# Patient Record
Sex: Female | Born: 1985 | Race: White | Hispanic: No | Marital: Single | Smoking: Never smoker
Health system: Southern US, Community
[De-identification: ages and names within clinical notes are randomized; demographics above are authoritative.]

## PROBLEM LIST (undated history)

## (undated) HISTORY — PX: WISDOM TOOTH EXTRACTION: SHX21

---

## 2017-11-21 ENCOUNTER — Other Ambulatory Visit: Payer: Self-pay | Admitting: Family

## 2017-11-21 DIAGNOSIS — R51 Headache: Principal | ICD-10-CM

## 2017-11-21 DIAGNOSIS — R519 Headache, unspecified: Secondary | ICD-10-CM

## 2017-11-21 DIAGNOSIS — G8929 Other chronic pain: Secondary | ICD-10-CM

## 2017-11-27 ENCOUNTER — Ambulatory Visit
Admission: RE | Admit: 2017-11-27 | Discharge: 2017-11-27 | Disposition: A | Discharge status: Home / Self Care | Payer: BLUE CROSS/BLUE SHIELD | Source: Ambulatory Visit | Attending: Family | Admitting: Family

## 2017-11-27 DIAGNOSIS — G8929 Other chronic pain: Secondary | ICD-10-CM

## 2017-11-27 DIAGNOSIS — R51 Headache: Principal | ICD-10-CM

## 2017-11-27 DIAGNOSIS — R519 Headache, unspecified: Secondary | ICD-10-CM

## 2017-11-27 MED ORDER — IOPAMIDOL (ISOVUE-300) INJECTION 61%
75.0000 mL | Freq: Once | INTRAVENOUS | Status: AC | PRN
  Administered 2017-11-27: 75 mL via INTRAVENOUS

## 2018-02-12 DIAGNOSIS — G43719 Chronic migraine without aura, intractable, without status migrainosus: Secondary | ICD-10-CM | POA: Diagnosis not present

## 2018-03-26 DIAGNOSIS — G43719 Chronic migraine without aura, intractable, without status migrainosus: Secondary | ICD-10-CM | POA: Diagnosis not present

## 2018-03-27 DIAGNOSIS — R51 Headache: Secondary | ICD-10-CM | POA: Diagnosis not present

## 2018-05-25 DIAGNOSIS — G43719 Chronic migraine without aura, intractable, without status migrainosus: Secondary | ICD-10-CM | POA: Diagnosis not present

## 2018-07-06 DIAGNOSIS — G43719 Chronic migraine without aura, intractable, without status migrainosus: Secondary | ICD-10-CM | POA: Diagnosis not present

## 2018-09-16 DIAGNOSIS — Z01419 Encounter for gynecological examination (general) (routine) without abnormal findings: Secondary | ICD-10-CM | POA: Diagnosis not present

## 2018-09-16 DIAGNOSIS — Z124 Encounter for screening for malignant neoplasm of cervix: Secondary | ICD-10-CM | POA: Diagnosis not present

## 2018-09-16 DIAGNOSIS — Z681 Body mass index (BMI) 19 or less, adult: Secondary | ICD-10-CM | POA: Diagnosis not present

## 2018-09-23 DIAGNOSIS — R5383 Other fatigue: Secondary | ICD-10-CM | POA: Diagnosis not present

## 2018-09-23 DIAGNOSIS — R002 Palpitations: Secondary | ICD-10-CM | POA: Diagnosis not present

## 2018-09-23 DIAGNOSIS — R079 Chest pain, unspecified: Secondary | ICD-10-CM | POA: Diagnosis not present

## 2018-09-23 DIAGNOSIS — R110 Nausea: Secondary | ICD-10-CM | POA: Diagnosis not present

## 2018-09-28 DIAGNOSIS — R5383 Other fatigue: Secondary | ICD-10-CM | POA: Diagnosis not present

## 2018-09-28 DIAGNOSIS — M25561 Pain in right knee: Secondary | ICD-10-CM | POA: Diagnosis not present

## 2018-09-28 DIAGNOSIS — M25562 Pain in left knee: Secondary | ICD-10-CM | POA: Diagnosis not present

## 2018-09-28 DIAGNOSIS — M545 Low back pain: Secondary | ICD-10-CM | POA: Diagnosis not present

## 2018-09-28 DIAGNOSIS — G43719 Chronic migraine without aura, intractable, without status migrainosus: Secondary | ICD-10-CM | POA: Diagnosis not present

## 2018-10-19 DIAGNOSIS — R079 Chest pain, unspecified: Secondary | ICD-10-CM | POA: Diagnosis not present

## 2018-10-26 DIAGNOSIS — Z681 Body mass index (BMI) 19 or less, adult: Secondary | ICD-10-CM | POA: Diagnosis not present

## 2018-10-26 DIAGNOSIS — R079 Chest pain, unspecified: Secondary | ICD-10-CM | POA: Diagnosis not present

## 2018-11-02 DIAGNOSIS — M545 Low back pain: Secondary | ICD-10-CM | POA: Diagnosis not present

## 2018-11-02 DIAGNOSIS — M62830 Muscle spasm of back: Secondary | ICD-10-CM | POA: Diagnosis not present

## 2018-11-02 DIAGNOSIS — M9903 Segmental and somatic dysfunction of lumbar region: Secondary | ICD-10-CM | POA: Diagnosis not present

## 2018-11-17 DIAGNOSIS — M9903 Segmental and somatic dysfunction of lumbar region: Secondary | ICD-10-CM | POA: Diagnosis not present

## 2018-11-17 DIAGNOSIS — M62830 Muscle spasm of back: Secondary | ICD-10-CM | POA: Diagnosis not present

## 2018-11-17 DIAGNOSIS — M545 Low back pain: Secondary | ICD-10-CM | POA: Diagnosis not present

## 2018-12-21 DIAGNOSIS — Z681 Body mass index (BMI) 19 or less, adult: Secondary | ICD-10-CM | POA: Diagnosis not present

## 2018-12-21 DIAGNOSIS — R079 Chest pain, unspecified: Secondary | ICD-10-CM | POA: Diagnosis not present

## 2018-12-24 DIAGNOSIS — M62830 Muscle spasm of back: Secondary | ICD-10-CM | POA: Diagnosis not present

## 2018-12-24 DIAGNOSIS — M545 Low back pain: Secondary | ICD-10-CM | POA: Diagnosis not present

## 2018-12-24 DIAGNOSIS — M9903 Segmental and somatic dysfunction of lumbar region: Secondary | ICD-10-CM | POA: Diagnosis not present

## 2018-12-28 DIAGNOSIS — G43719 Chronic migraine without aura, intractable, without status migrainosus: Secondary | ICD-10-CM | POA: Diagnosis not present

## 2019-01-18 ENCOUNTER — Ambulatory Visit: Payer: 59 | Admitting: Rheumatology

## 2019-01-18 ENCOUNTER — Ambulatory Visit (HOSPITAL_COMMUNITY)
Admission: RE | Admit: 2019-01-18 | Discharge: 2019-01-18 | Disposition: A | Discharge status: Home / Self Care | Payer: 59 | Source: Ambulatory Visit | Attending: Rheumatology | Admitting: Rheumatology

## 2019-01-18 ENCOUNTER — Encounter (INDEPENDENT_AMBULATORY_CARE_PROVIDER_SITE_OTHER): Payer: Self-pay

## 2019-01-18 ENCOUNTER — Encounter: Payer: Self-pay | Admitting: Rheumatology

## 2019-01-18 VITALS — BP 95/66 | HR 69 | Resp 12 | Ht 66.0 in | Wt 111.0 lb

## 2019-01-18 DIAGNOSIS — R5383 Other fatigue: Secondary | ICD-10-CM

## 2019-01-18 DIAGNOSIS — R002 Palpitations: Secondary | ICD-10-CM

## 2019-01-18 DIAGNOSIS — Z8669 Personal history of other diseases of the nervous system and sense organs: Secondary | ICD-10-CM | POA: Diagnosis not present

## 2019-01-18 DIAGNOSIS — F411 Generalized anxiety disorder: Secondary | ICD-10-CM | POA: Diagnosis not present

## 2019-01-18 DIAGNOSIS — R0602 Shortness of breath: Secondary | ICD-10-CM | POA: Diagnosis not present

## 2019-01-18 DIAGNOSIS — R0789 Other chest pain: Secondary | ICD-10-CM | POA: Diagnosis present

## 2019-01-18 DIAGNOSIS — R079 Chest pain, unspecified: Secondary | ICD-10-CM | POA: Diagnosis not present

## 2019-01-18 NOTE — Progress Notes (Signed)
Office Visit Note  Patient: Paula Stokes             Date of Birth: May 28, 1986           MRN: [id number]             PCP: Lind Guest, NP Referring: Camila Li, MD Visit Date: 01/18/2019 Occupation: Horticultural specialist, Cashier at [employer]:  Recurrent chest pain.   History of Present Illness: Paula Stokes is a 33 y.o. female seen in consultation per request of Dr. Jeral Pinch.  According to patient her symptoms started when she was 10 years ago with left-sided chest pain.  She states at the age of 68 she had an echocardiogram and was told that she had nonspecific thickening of the mitral valve leaflet.  She states the episodes continued about once a month.  At age 64 she had another echocardiogram  and was told that these findings were same.  Patient states she was given the option of taking a medication or take no treatment.  She opted for no treatment.  She states the episodes usually occurred 1 week prior to her menstrual cycle.  According to patient in the last 1 year the symptoms have become more frequent and almost persistent.  She states the symptoms last about 2 seconds to 2 hours and could be very severe pain.  She was seen by cardiologist recently who did echocardiogram and told her that the symptoms are not related to her heart.  She states she does have sometimes shortness of breath with those episodes.  She denies discomfort in any other joints.  She states she has occasional lower back pain which she relates to working at [employer].  She sometimes have knee joint pain but no knee joint swelling.  There is no family history of ankylosing spondylitis or psoriasis.  Activities of Daily Living:  Patient reports morning stiffness for 0 minutes.   Patient Reports nocturnal pain.  Difficulty dressing/grooming: Denies Difficulty climbing stairs: Reports Difficulty getting out of chair: Denies Difficulty using hands for taps, buttons, cutlery, and/or writing:  Denies  Review of Systems  Constitutional: Positive for fatigue.  HENT: Positive for mouth dryness. Negative for mouth sores and nose dryness.   Eyes: Negative for itching and dryness.  Respiratory: Positive for shortness of breath and difficulty breathing. Negative for wheezing.   Cardiovascular: Positive for chest pain. Negative for swelling in legs/feet.  Gastrointestinal: Negative for abdominal pain, blood in stool and constipation.  Endocrine: Negative for increased urination.  Genitourinary: Negative for painful urination and pelvic pain.  Musculoskeletal: Positive for arthralgias and joint pain. Negative for joint swelling and morning stiffness.  Skin: Negative for rash and redness.  Allergic/Immunologic: Negative for susceptible to infections.  Neurological: Positive for weakness. Negative for dizziness, light-headedness, numbness and memory loss.  Hematological: Negative for swollen glands.  Psychiatric/Behavioral: Negative for confusion. The patient is not nervous/anxious.     PMFS History:  Patient Active Problem List   Diagnosis Date Noted  . GAD (generalized anxiety disorder) 01/18/2019  . Hx of migraines 01/18/2019    History reviewed. No pertinent past medical history.  Family History  Problem Relation Age of Onset  . Healthy Mother   . Healthy Father    Past Surgical History:  Procedure Laterality Date  . WISDOM TOOTH EXTRACTION     early 2s   Social History   Social History Narrative  . Not on file    There is no immunization  history on file for this patient.   Objective: Vital Signs: BP 95/66 (BP Location: Right Arm, Patient Position: Sitting, Cuff Size: Normal)   Pulse 69   Resp 12   Ht '5\' 6"'$  (1.676 m)   Wt 111 lb (50.3 kg)   BMI 17.92 kg/m    Physical Exam Vitals signs and nursing note reviewed.  Constitutional:      Appearance: She is well-developed.  HENT:     Head: Normocephalic and atraumatic.  Eyes:     Conjunctiva/sclera:  Conjunctivae normal.  Neck:     Musculoskeletal: Normal range of motion.  Cardiovascular:     Rate and Rhythm: Normal rate and regular rhythm.     Heart sounds: Normal heart sounds.  Pulmonary:     Effort: Pulmonary effort is normal.     Breath sounds: Normal breath sounds.  Abdominal:     General: Bowel sounds are normal.     Palpations: Abdomen is soft.  Lymphadenopathy:     Cervical: No cervical adenopathy.  Skin:    General: Skin is warm and dry.     Capillary Refill: Capillary refill takes less than 2 seconds.  Neurological:     Mental Status: She is alert and oriented to person, place, and time.  Psychiatric:        Behavior: Behavior normal.      Musculoskeletal Exam: C-spine thoracic and lumbar spine good range of motion.  No SI joint tenderness was noted.  Shoulder joints elbow joints wrist joint MCPs PIPs DIPs with good range of motion with no synovitis.  Hip joints knee joints ankles MTPs PIPs with good range of motion with no synovitis.  He had no tenderness or costochondral junction today on examination.  CDAI Exam: CDAI Score: Not documented Patient Global Assessment: Not documented; Provider Global Assessment: Not documented Swollen: Not documented; Tender: Not documented Joint Exam   Not documented   There is currently no information documented on the homunculus. Go to the Rheumatology activity and complete the homunculus joint exam.  Investigation: No additional findings.  Imaging: No results found.  Recent Labs: No results found for: WBC, HGB, PLT, NA, K, CL, CO2, GLUCOSE, BUN, CREATININE, BILITOT, ALKPHOS, AST, ALT, PROT, ALBUMIN, CALCIUM, GFRAA, QFTBGOLD, QFTBGOLDPLUS  Speciality Comments: No specialty comments available.  Procedures:  No procedures performed Allergies: Patient has no known allergies.   Assessment / Plan:     Visit Diagnoses: Non-cardiac chest pain - started at age 58, the symptoms have become more persistent now which she  describes in the left costochondral region.  She also have episodes of shortness of breath with that.  She had no tenderness over costochondral junction today.  All the joints were in good range of motion with no synovitis.  TTE, EKG, D-dimer, mg level ordered 09/23/18 -plan to obtain a baseline chest x-ray and the following labs.  If the work-up is negative I will refer her to pulmonologist.  Patient reports that she has no relief from anti-inflammatories.  Plan: DG Chest 2 View, TLX-B26 antigen, Cyclic citrul peptide antibody, IgG, Rheumatoid factor, Sedimentation rate  Palpitations-related to anxiety.  Hx of migraines  GAD (generalized anxiety disorder)  Other fatigue - Plan: CBC with Differential/Platelet, COMPLETE METABOLIC PANEL WITH GFR, CK, TSH   Orders: Orders Placed This Encounter  Procedures  . DG Chest 2 View  . CBC with Differential/Platelet  . COMPLETE METABOLIC PANEL WITH GFR  . CK  . TSH  . HLA-B27 antigen  . Cyclic citrul peptide  antibody, IgG  . Rheumatoid factor  . Sedimentation rate   No orders of the defined types were placed in this encounter.   Face-to-face time spent with patient was 45 minutes. Greater than 50% of time was spent in counseling and coordination of care.  Follow-Up Instructions: Return for Chest pain.   Bo Merino, MD  Note - This record has been created using Editor, commissioning.  Chart creation errors have been sought, but may not always  have been located. Such creation errors do not reflect on  the standard of medical care.

## 2019-01-19 LAB — COMPLETE METABOLIC PANEL WITH GFR
AG RATIO: 1.6 (calc) (ref 1.0–2.5)
ALT: 20 U/L (ref 6–29)
AST: 18 U/L (ref 10–30)
Albumin: 4.5 g/dL (ref 3.6–5.1)
Alkaline phosphatase (APISO): 79 U/L (ref 31–125)
BILIRUBIN TOTAL: 0.8 mg/dL (ref 0.2–1.2)
BUN: 11 mg/dL (ref 7–25)
CHLORIDE: 109 mmol/L (ref 98–110)
CO2: 26 mmol/L (ref 20–32)
Calcium: 9.7 mg/dL (ref 8.6–10.2)
Creat: 0.77 mg/dL (ref 0.50–1.10)
GFR, EST AFRICAN AMERICAN: 119 mL/min/{1.73_m2} (ref >=60)
GFR, Est Non African American: 103 mL/min/{1.73_m2} (ref >=60)
GLUCOSE: 90 mg/dL (ref 65–99)
Globulin: 2.8 g/dL (calc) (ref 1.9–3.7)
POTASSIUM: 3.7 mmol/L (ref 3.5–5.3)
Sodium: 142 mmol/L (ref 135–146)
Total Protein: 7.3 g/dL (ref 6.1–8.1)

## 2019-01-19 LAB — CBC WITH DIFFERENTIAL/PLATELET
Absolute Monocytes: 320 cells/uL (ref 200–950)
BASOS PCT: 1 %
Basophils Absolute: 39 cells/uL (ref 0–200)
EOS PCT: 2.3 %
Eosinophils Absolute: 90 cells/uL (ref 15–500)
HEMATOCRIT: 40.8 % (ref 35.0–45.0)
HEMOGLOBIN: 13.9 g/dL (ref 11.7–15.5)
Lymphs Abs: 1482 cells/uL (ref 850–3900)
MCH: 32 pg (ref 27.0–33.0)
MCHC: 34.1 g/dL (ref 32.0–36.0)
MCV: 93.8 fL (ref 80.0–100.0)
MONOS PCT: 8.2 %
MPV: 13.7 fL — AB (ref 7.5–12.5)
NEUTROS ABS: 1970 {cells}/uL (ref 1500–7800)
Neutrophils Relative %: 50.5 %
Platelets: 158 10*3/uL (ref 140–400)
RBC: 4.35 10*6/uL (ref 3.80–5.10)
RDW: 11.8 % (ref 11.0–15.0)
Total Lymphocyte: 38 %
WBC: 3.9 10*3/uL (ref 3.8–10.8)

## 2019-01-19 LAB — TSH: TSH: 1.11 mIU/L

## 2019-01-19 LAB — CK: Total CK: 29 U/L (ref 29–143)

## 2019-01-19 LAB — RHEUMATOID FACTOR: Rheumatoid fact SerPl-aCnc: <14 IU/mL (ref <14)

## 2019-01-19 LAB — CYCLIC CITRUL PEPTIDE ANTIBODY, IGG: Cyclic Citrullin Peptide Ab: <16 UNITS

## 2019-01-19 LAB — HLA-B27 ANTIGEN: HLA-B27 ANTIGEN: NEGATIVE

## 2019-01-19 LAB — SEDIMENTATION RATE: Sed Rate: 2 mm/h (ref 0–20)

## 2019-01-19 NOTE — Progress Notes (Signed)
All autoimmune work up is negative. Please cancel the fu visit. Pt may discuss referral to a pulmonologist with her PCP for SOB.

## 2020-09-15 ENCOUNTER — Other Ambulatory Visit: Payer: Self-pay

## 2020-09-15 ENCOUNTER — Emergency Department (HOSPITAL_COMMUNITY): Payer: 59

## 2020-09-15 ENCOUNTER — Emergency Department (HOSPITAL_COMMUNITY)
Admission: EM | Admit: 2020-09-15 | Discharge: 2020-09-16 | Disposition: A | Discharge status: Home / Self Care | Payer: 59 | Attending: Emergency Medicine | Admitting: Emergency Medicine

## 2020-09-15 ENCOUNTER — Encounter (HOSPITAL_COMMUNITY): Payer: Self-pay | Admitting: Emergency Medicine

## 2020-09-15 DIAGNOSIS — R0781 Pleurodynia: Secondary | ICD-10-CM | POA: Insufficient documentation

## 2020-09-15 DIAGNOSIS — R079 Chest pain, unspecified: Secondary | ICD-10-CM | POA: Diagnosis present

## 2020-09-15 LAB — CBC
HCT: 41.4 % (ref 36.0–46.0)
Hemoglobin: 13.6 g/dL (ref 12.0–15.0)
MCH: 31.9 pg (ref 26.0–34.0)
MCHC: 32.9 g/dL (ref 30.0–36.0)
MCV: 97.2 fL (ref 80.0–100.0)
Platelets: 171 10*3/uL (ref 150–400)
RBC: 4.26 MIL/uL (ref 3.87–5.11)
RDW: 11.9 % (ref 11.5–15.5)
WBC: 6.4 10*3/uL (ref 4.0–10.5)
nRBC: 0 % (ref 0.0–0.2)

## 2020-09-15 LAB — BASIC METABOLIC PANEL
Anion gap: 11 (ref 5–15)
BUN: 13 mg/dL (ref 6–20)
CO2: 21 mmol/L — ABNORMAL LOW (ref 22–32)
Calcium: 9.4 mg/dL (ref 8.9–10.3)
Chloride: 109 mmol/L (ref 98–111)
Creatinine, Ser: 0.77 mg/dL (ref 0.44–1.00)
GFR, Estimated: >60 mL/min (ref >60)
Glucose, Bld: 92 mg/dL (ref 70–99)
Potassium: 3.4 mmol/L — ABNORMAL LOW (ref 3.5–5.1)
Sodium: 141 mmol/L (ref 135–145)

## 2020-09-15 LAB — I-STAT BETA HCG BLOOD, ED (MC, WL, AP ONLY): I-stat hCG, quantitative: <5 m[IU]/mL (ref <5)

## 2020-09-15 LAB — TROPONIN I (HIGH SENSITIVITY)
Troponin I (High Sensitivity): <2 ng/L (ref <18)
Troponin I (High Sensitivity): <2 ng/L (ref <18)

## 2020-09-15 MED ORDER — LIDOCAINE VISCOUS HCL 2 % MT SOLN
15.0000 mL | Freq: Once | OROMUCOSAL | Status: AC
  Administered 2020-09-16: 15 mL via ORAL
  Filled 2020-09-15: qty 15

## 2020-09-15 MED ORDER — ALUM & MAG HYDROXIDE-SIMETH 200-200-20 MG/5ML PO SUSP
30.0000 mL | Freq: Once | ORAL | Status: AC
  Administered 2020-09-16: 30 mL via ORAL
  Filled 2020-09-15: qty 30

## 2020-09-15 NOTE — ED Triage Notes (Signed)
Pt arrives to ED with c/o of chest paint hat she states has been ongoing since Saturday. She has had pain like this before but has never lasted this long. Pain is on the left under her breast. Hurts worse to take a deep breath. Pain does not radiate.

## 2020-09-15 NOTE — ED Provider Notes (Signed)
[facility] EMERGENCY DEPARTMENT Provider Note  CSN: [id number] Arrival date & time: 09/15/20 1358  Chief Complaint(s) Chest Pain  HPI Paula Stokes is a 34 y.o. female    Chest Pain Pain location:  L chest Pain radiates to:  Does not radiate Pain severity:  Moderate Onset quality:  Sudden Duration:  6 days Timing:  Constant Progression:  Unchanged Chronicity:  Recurrent Relieved by:  Nothing Worsened by:  Deep breathing Associated symptoms: no back pain, no cough, no fever, no heartburn, no lower extremity edema, no nausea and no shortness of breath     Past Medical History History reviewed. No pertinent past medical history. Patient Active Problem List   Diagnosis Date Noted  . GAD (generalized anxiety disorder) 01/18/2019  . Hx of migraines 01/18/2019   Home Medication(s) Prior to Admission medications   Medication Sig Start Date End Date Taking? Authorizing Provider  Biotin 1000 MCG tablet Take 1,000 mcg by mouth daily.   Yes [provider]  cetirizine (ZYRTEC) 10 MG tablet Take 10 mg by mouth as needed for allergies.   Yes [provider]  cyclobenzaprine (FLEXERIL) 10 MG tablet Take 10 mg by mouth at bedtime.   Yes [provider]  Multiple Vitamins-Minerals (MULTIVITAMIN PO) Take 1 each by mouth daily.    Yes [provider]  QUDEXY XR 150 MG CS24 sprinkle capsule Take 150 mg by mouth at bedtime.  01/06/19  Yes [provider]                                                                                                                                    Past Surgical History Past Surgical History:  Procedure Laterality Date  . WISDOM TOOTH EXTRACTION     early 17s   Family History Family History  Problem Relation Age of Onset  . Healthy Mother   . Healthy Father     Social History Social History   Tobacco Use  . Smoking status: Never Smoker  . Smokeless tobacco: Never Used  Vaping Use   . Vaping Use: Never used  Substance Use Topics  . Alcohol use: Yes    Comment: rarely   . Drug use: Never   Allergies Patient has no known allergies.  Review of Systems Review of Systems  Constitutional: Negative for fever.  Respiratory: Negative for cough and shortness of breath.   Cardiovascular: Positive for chest pain.  Gastrointestinal: Negative for heartburn and nausea.  Musculoskeletal: Negative for back pain.   All other systems are reviewed and are negative for acute change except as noted in the HPI  Physical Exam Vital Signs  I have reviewed the triage vital signs BP 108/70 (BP Location: Right Arm)   Pulse 72   Temp 98.8 F (37.1 C) (Oral)   Resp 15   Ht 5\' 6"  (1.676 m)   Wt 50.8 kg   SpO2 100%  BMI 18.08 kg/m   Physical Exam  ED Results and Treatments Labs (all labs ordered are listed, but only abnormal results are displayed) Labs Reviewed  BASIC METABOLIC PANEL - Abnormal; Notable for the following components:      Result Value   Potassium 3.4 (*)    CO2 21 (*)    All other components within normal limits  D-DIMER, QUANTITATIVE (NOT AT [facility]) - Abnormal; Notable for the following components:   D-Dimer, Quant 0.52 (*)    All other components within normal limits  CBC  I-STAT BETA HCG BLOOD, ED ([facility], [facility], [facility] ONLY)  TROPONIN I (HIGH SENSITIVITY)  TROPONIN I (HIGH SENSITIVITY)                                                                                                                         EKG  EKG Interpretation  Date/Time:  Friday September 15 2020 14:11:59 EDT Ventricular Rate:  99 PR Interval:  118 QRS Duration: 84 QT Interval:  334 QTC Calculation: 428 R Axis:   100 Text Interpretation: Normal sinus rhythm Rightward axis Borderline ECG No old tracing to compare Confirmed by Drema Pry [phone]) on 09/15/2020 11:49:38 PM      Radiology DG Chest 2 View  Result Date: 09/15/2020 CLINICAL DATA:  Chest pain. EXAM: CHEST - 2 VIEW  COMPARISON:  January 18, 2019. FINDINGS: The heart size and mediastinal contours are within normal limits. Both lungs are clear. No pneumothorax or pleural effusion is noted. The visualized skeletal structures are unremarkable. IMPRESSION: No active cardiopulmonary disease. Electronically Signed   By: Lupita Raider M.D.   On: 09/15/2020 14:40   CT Angio Chest PE W and/or Wo Contrast  Result Date: 09/16/2020 CLINICAL DATA:  Evaluate for acute pulmonary embolus. EXAM: CT ANGIOGRAPHY CHEST WITH CONTRAST TECHNIQUE: Multidetector CT imaging of the chest was performed using the standard protocol during bolus administration of intravenous contrast. Multiplanar CT image reconstructions and MIPs were obtained to evaluate the vascular anatomy. CONTRAST:  74mL OMNIPAQUE IOHEXOL 350 MG/ML SOLN COMPARISON:  None. FINDINGS: Cardiovascular: Satisfactory opacification of the pulmonary arteries to the segmental level. No evidence of pulmonary embolism. Normal heart size. No pericardial effusion. Mediastinum/Nodes: No enlarged mediastinal, hilar, or axillary lymph nodes. Thyroid gland, trachea, and esophagus demonstrate no significant findings. Lungs/Pleura: No pleural effusion, airspace consolidation, or atelectasis. No pneumothorax. Upper Abdomen: No acute abnormality. Musculoskeletal: No chest wall abnormality. No acute or significant osseous findings. Review of the MIP images confirms the above findings. IMPRESSION: Negative for acute pulmonary embolus. Electronically Signed   By: Signa Kell M.D.   On: 09/16/2020 04:40    Pertinent labs & imaging results that were available during my care of the patient were reviewed by me and considered in my medical decision making (see chart for details).  Medications Ordered in ED Medications  alum & mag hydroxide-simeth (MAALOX/MYLANTA) 200-200-20 MG/5ML suspension 30 mL (30 mLs Oral Given 09/16/20 0148)    And  lidocaine (XYLOCAINE)  2 % viscous mouth solution 15 mL (15 mLs  Oral Given 09/16/20 0148)  ketorolac (TORADOL) 15 MG/ML injection 15 mg (15 mg Intravenous Given 09/16/20 0348)  iohexol (OMNIPAQUE) 350 MG/ML injection 100 mL (100 mLs Intravenous Contrast Given 09/16/20 0401)                                                                                                                                    Procedures Procedures  (including critical care time)  Medical Decision Making / ED Course I have reviewed the nursing notes for this encounter and the patient's prior records (if available in EHR or on provided paperwork).   Payten Hobin was evaluated in Emergency Department on 09/16/2020 for the symptoms described in the history of present illness. She was evaluated in the context of the global COVID-19 pandemic, which necessitated consideration that the patient might be at risk for infection with the SARS-CoV-2 virus that causes COVID-19. Institutional protocols and algorithms that pertain to the evaluation of patients at risk for COVID-19 are in a state of rapid change based on information released by regulatory bodies including the CDC and federal and state organizations. These policies and algorithms were followed during the patient's care in the ED.  Patient presents with atypical chest pain.  EKG without acute ischemic changes or evidence of pericarditis.  Serial troponins negative x2.  Doubt ACS.  Presentation not classic for aortic dissection or esophageal perforation.  Chest x-ray without evidence suggestive of pneumonia, pneumothorax, pneumomediastinum.  No abnormal contour of the mediastinum to suggest dissection. No evidence of acute injuries.  Labs without leukocytosis or anemia.  Low pretest probability for pulmonary embolism but dimer slightly elevated.  CTA negative.  Recommended PCP follow-up      Final Clinical Impression(s) / ED Diagnoses Final diagnoses:  Pleuritic chest pain    The patient appears reasonably screened and/or  stabilized for discharge and I doubt any other medical condition or other [facility] requiring further screening, evaluation, or treatment in the ED at this time prior to discharge. Safe for discharge with strict return precautions.  Disposition: Discharge  Condition: Good  I have discussed the results, Dx and Tx plan with the patient/family who expressed understanding and agree(s) with the plan. Discharge instructions discussed at length. The patient/family was given strict return precautions who verbalized understanding of the instructions. No further questions at time of discharge.    ED Discharge Orders    None      Follow Up: Shirlee Latch, NP [address] [place] [address] [phone]  Schedule an appointment as soon as possible for a visit       This chart was dictated using voice recognition software.  Despite best efforts to proofread,  errors can occur which can change the documentation meaning.   Nira Conn, MD 09/16/20 [phone]

## 2020-09-16 ENCOUNTER — Emergency Department (HOSPITAL_COMMUNITY): Payer: 59

## 2020-09-16 LAB — D-DIMER, QUANTITATIVE: D-Dimer, Quant: 0.52 ug/mL-FEU — ABNORMAL HIGH (ref 0.00–0.50)

## 2020-09-16 MED ORDER — IOHEXOL 350 MG/ML SOLN
100.0000 mL | Freq: Once | INTRAVENOUS | Status: AC | PRN
  Administered 2020-09-16: 100 mL via INTRAVENOUS

## 2020-09-16 MED ORDER — KETOROLAC TROMETHAMINE 15 MG/ML IJ SOLN
15.0000 mg | Freq: Once | INTRAMUSCULAR | Status: AC
  Administered 2020-09-16: 15 mg via INTRAVENOUS
  Filled 2020-09-16: qty 1

## 2020-10-17 ENCOUNTER — Encounter: Payer: Self-pay | Admitting: Pulmonary Disease

## 2020-10-17 ENCOUNTER — Other Ambulatory Visit: Payer: Self-pay

## 2020-10-17 ENCOUNTER — Ambulatory Visit: Payer: 59 | Admitting: Pulmonary Disease

## 2020-10-17 VITALS — BP 104/60 | HR 74 | Ht 66.0 in | Wt 117.4 lb

## 2020-10-17 DIAGNOSIS — R079 Chest pain, unspecified: Secondary | ICD-10-CM

## 2020-10-17 NOTE — Patient Instructions (Addendum)
Please give [place] a call if symptoms worsen.

## 2020-10-17 NOTE — Progress Notes (Signed)
Synopsis: Referred by Haydee Salter, NP for chest pain and shortness of breath  Subjective:   PATIENT ID: Paula Stokes GENDER: female DOB: 1986-04-07, MRN: [id number]   HPI  Chief Complaint  Patient presents with  . Consult    Pt is being referred due to having chest pains since childhood. Pt has been to see a cardiologist but has been told it is not heart related. Pt states she does have SOB when she has her pains. Pain is a sharp stabbing pain on left side under breast.   Paula Stokes is a 34 year old woman, never smoker with history of migraines who is referred to pulmonary clinic for chest discomfort and shortness of breath.   She reports she has been having chest discomfort since she was about 34 years old. Over recent years she has had left sided chest discomfort that has been occurring on a monthly basis prior to her menstrual cycles. A more recent episode of the chest discomfort persisted for a few days and hurt to take a full breath prompting her to go to the emergency room where her ddimer was >0.5 and a CTA chest was performed. The CT scan was unremarkable for pulmonary emboli and her lung parencyma and airways are normal. The chest discomfort typically occurs 1 week before her menstrual cycles and can last minutes to hours. The pain can be sharp or dull/nagging.    She has been evaluated by cardiology and has had normal echocardiograms in the past. She has been evaluated by rheumatology with negative RA, CCP, CPK and HLA-B27. She is being trialed on various medications for pain relief.   She denies wheezing or cough. She does have knee pain which is thought to be possible osteoarthritis, otherwise no joint or muscle aches. No skin rashes.    History reviewed. No pertinent past medical history.   Family History  Problem Relation Age of Onset  . Healthy Mother   . Healthy Father      Social History   Socioeconomic History  . Marital status: Single    Spouse name: Not on  file  . Number of children: Not on file  . Years of education: Not on file  . Highest education level: Not on file  Occupational History  . Not on file  Tobacco Use  . Smoking status: Never Smoker  . Smokeless tobacco: Never Used  Vaping Use  . Vaping Use: Never used  Substance and Sexual Activity  . Alcohol use: Yes    Comment: rarely   . Drug use: Never  . Sexual activity: Not on file  Other Topics Concern  . Not on file  Social History Narrative  . Not on file   Social Determinants of Health   Financial Resource Strain: Not on file  Food Insecurity: Not on file  Transportation Needs: Not on file  Physical Activity: Not on file  Stress: Not on file  Social Connections: Not on file  Intimate Partner Violence: Not on file     No Known Allergies   Outpatient Medications Prior to Visit  Medication Sig Dispense Refill  . Biotin 1000 MCG tablet Take 1,000 mcg by mouth daily.    . cetirizine (ZYRTEC) 10 MG tablet Take 10 mg by mouth as needed for allergies.    . cyclobenzaprine (FLEXERIL) 10 MG tablet Take 10 mg by mouth at bedtime.    . Multiple Vitamins-Minerals (MULTIVITAMIN PO) Take 1 each by mouth daily.     Georjean Mode  XR 150 MG CS24 sprinkle capsule Take 150 mg by mouth at bedtime.      No facility-administered medications prior to visit.    Review of Systems  Constitutional: Negative for chills, fever, malaise/fatigue and weight loss.  HENT: Negative for congestion, sinus pain and sore throat.   Eyes: Negative for photophobia.  Respiratory: Negative for cough, hemoptysis, sputum production, shortness of breath and wheezing.   Cardiovascular: Positive for chest pain. Negative for palpitations, orthopnea, claudication, leg swelling and PND.  Gastrointestinal: Negative for abdominal pain, heartburn, nausea and vomiting.  Genitourinary: Negative.   Musculoskeletal: Negative for joint pain and myalgias.  Skin: Negative for itching and rash.  Neurological: Negative  for dizziness and weakness.  Endo/Heme/Allergies: Does not bruise/bleed easily.   Objective:   Vitals:   10/17/20 1607  BP: 104/60  Pulse: 74  SpO2: 100%  Weight: 117 lb 6.4 oz (53.3 kg)  Height: _0  (1.676 m)     Physical Exam Constitutional:      General: She is not in acute distress.    Appearance: Normal appearance. She is normal weight. She is not ill-appearing.  HENT:     Head: Normocephalic and atraumatic.  Eyes:     General: No scleral icterus.    Conjunctiva/sclera: Conjunctivae normal.  Cardiovascular:     Rate and Rhythm: Normal rate and regular rhythm.     Pulses: Normal pulses.     Heart sounds: Normal heart sounds. No murmur heard.   Pulmonary:     Effort: Pulmonary effort is normal.     Breath sounds: Normal breath sounds. No wheezing, rhonchi or rales.  Abdominal:     General: Bowel sounds are normal.     Palpations: Abdomen is soft.  Musculoskeletal:     Right lower leg: No edema.     Left lower leg: No edema.  Skin:    General: Skin is warm and dry.  Neurological:     General: No focal deficit present.     Mental Status: She is alert.  Psychiatric:        Mood and Affect: Mood normal.        Behavior: Behavior normal.        Thought Content: Thought content normal.        Judgment: Judgment normal.    CBC    Component Value Date/Time   WBC 6.4 09/15/2020 1424   RBC 4.26 09/15/2020 1424   HGB 13.6 09/15/2020 1424   HCT 41.4 09/15/2020 1424   PLT 171 09/15/2020 1424   MCV 97.2 09/15/2020 1424   MCH 31.9 09/15/2020 1424   MCHC 32.9 09/15/2020 1424   RDW 11.9 09/15/2020 1424   LYMPHSABS 1,482 01/18/2019 1002   EOSABS 90 01/18/2019 1002   BASOSABS 39 01/18/2019 1002   BMP Latest Ref Rng & Units 09/15/2020 01/18/2019  Glucose 70 - 99 mg/dL 92 90  BUN 6 - 20 mg/dL 13 11  Creatinine 0.44 - 1.00 mg/dL 0.77 0.77  BUN/Creat Ratio 6 - 22 (calc) - NOT APPLICABLE  Sodium 119 - 145 mmol/L 141 142  Potassium 3.5 - 5.1 mmol/L 3.4(L) 3.7   Chloride 98 - 111 mmol/L 109 109  CO2 22 - 32 mmol/L 21(L) 26  Calcium 8.9 - 10.3 mg/dL 9.4 9.7   Chest imaging: CTA Chest 09/16/20 Cardiovascular: Satisfactory opacification of the pulmonary arteries to the segmental level. No evidence of pulmonary embolism. Normal heart size. No pericardial effusion.  Mediastinum/Nodes: No enlarged mediastinal, hilar, or axillary lymph  nodes. Thyroid gland, trachea, and esophagus demonstrate no significant findings.  Lungs/Pleura: No pleural effusion, airspace consolidation, or atelectasis. No pneumothorax.  Upper Abdomen: No acute abnormality.  Musculoskeletal: No chest wall abnormality. No acute or significant osseous findings.  PFT: No flowsheet data found.  Echo: 2019 Normal left ventricular systolic function  Ejection fraction is visually estimated at 60-65%  Normal ECHO    Assessment & Plan:   Chest pain, unspecified type  Discussion: Paula Stokes is a 34 year old woman, never smoker with history of migraines who is referred to pulmonary clinic for chest discomfort and shortness of breath.   The etiology of her chest discomfort is not clear. The CT chest does not indicate any nodules or abnormal parenchymal lesions concerning for thoracic endometriosis based on the history of cyclical discomfort. She also does not have history of hemoptysis.  She is not tender to palpation on exam today bringing into question if this is even musculoskeletal pain. One consideration is slipping rib syndrome based on the location of pain, the recurring nature of symptoms and her age. I will reach out to rheumatology and physiatry if they are comfortable evaluating for this entity. If not, we can touch base with thoracic surgery.    Follow up as needed.  Freda Jackson, MD [facility] Office: [phone]  See Amion for Pager Details    Current Outpatient Medications:  .  Biotin 1000 MCG tablet, Take 1,000 mcg by  mouth daily., Disp: , Rfl:  .  cetirizine (ZYRTEC) 10 MG tablet, Take 10 mg by mouth as needed for allergies., Disp: , Rfl:  .  cyclobenzaprine (FLEXERIL) 10 MG tablet, Take 10 mg by mouth at bedtime., Disp: , Rfl:  .  Multiple Vitamins-Minerals (MULTIVITAMIN PO), Take 1 each by mouth daily. , Disp: , Rfl:  .  QUDEXY XR 150 MG CS24 sprinkle capsule, Take 150 mg by mouth at bedtime. , Disp: , Rfl:

## 2020-10-20 ENCOUNTER — Telehealth: Payer: Self-pay | Admitting: Pulmonary Disease

## 2020-10-20 DIAGNOSIS — M940 Chondrocostal junction syndrome [Tietze]: Secondary | ICD-10-CM

## 2020-10-20 NOTE — Telephone Encounter (Signed)
Please refer her to Dr. Faith Rogue at [facility]. The slipping rib syndrome is not detectable on the CT scan but can be detected via ultra sound or physical exam. I am not as familiar with this syndrome but PM&R has agreed to see the patient for further eval.   Thanks, Cletis Athens

## 2020-10-20 NOTE — Telephone Encounter (Signed)
Patient is returning phone call. Patient phone number is [phone].

## 2020-10-20 NOTE — Telephone Encounter (Signed)
Spoke with patient who states that she was seen in the office on 10/17/20 and Dr. Francine Graven recommended to refer her to somewhere for a condition that was mentioned that she might have. She couldn't remember what the condition might be or who the referral was to but stated that she would like to procced forward with it. Also wanted to know if this would have shown up on her scan?  Looking at last OV note:  She is not tender to palpation on exam today bringing into question if this is even musculoskeletal pain. One consideration is slipping rib syndrome based on the location of pain, the recurring nature of symptoms and her age. I will reach out to rheumatology and physiatry if they are comfortable evaluating for this entity. If not, we can touch base with thoracic surgery.    Dr. Francine Graven please advise

## 2020-10-20 NOTE — Telephone Encounter (Signed)
Called and spoke with patient to let her know that we were placing referral to [facility] and that they would call her to schedule appointment once they get it. Advised her that Dr. Francine Graven may have her see 2 different providers at some point. She expressed understanding. Referral placed. Nothing further needed at this time.

## 2020-10-27 ENCOUNTER — Encounter: Payer: Self-pay | Admitting: Physical Medicine and Rehabilitation

## 2020-12-15 ENCOUNTER — Other Ambulatory Visit: Payer: Self-pay

## 2020-12-15 ENCOUNTER — Ambulatory Visit (HOSPITAL_COMMUNITY)
Admission: RE | Admit: 2020-12-15 | Discharge: 2020-12-15 | Disposition: A | Discharge status: Home / Self Care | Payer: 59 | Source: Ambulatory Visit | Attending: Physical Medicine and Rehabilitation | Admitting: Physical Medicine and Rehabilitation

## 2020-12-15 ENCOUNTER — Encounter: Payer: 59 | Attending: Physical Medicine and Rehabilitation | Admitting: Physical Medicine and Rehabilitation

## 2020-12-15 ENCOUNTER — Encounter: Payer: Self-pay | Admitting: Physical Medicine and Rehabilitation

## 2020-12-15 VITALS — BP 113/74 | HR 88 | Temp 98.1°F | Ht 66.0 in | Wt 114.6 lb

## 2020-12-15 DIAGNOSIS — R0781 Pleurodynia: Secondary | ICD-10-CM | POA: Insufficient documentation

## 2020-12-15 DIAGNOSIS — R1032 Left lower quadrant pain: Secondary | ICD-10-CM | POA: Insufficient documentation

## 2020-12-15 NOTE — Progress Notes (Addendum)
Subjective:    Patient ID: Paula Stokes, female    DOB: 28-Nov-1985, 35 y.o.   MRN: [id number]  HPI  Paula Stokes is a 35 year old woman with chest pain since age 35.   She has seen cardiology, pulmonology, ED, and has been told they are not sure what is wrong.   She has never seen gastroenterology.   Average pain is 9/10. Pain right now is 0/10  The pain feels intermittent, sharp, and stabbing.   It is not associated with anxiety.   It frequently happens the week before her period, she feels because her body is under the most stress at that time.   When she lifts too much at her work. Or when she wears a tight bra, the pain feels worse.   She does not get constipated.   Normal bleeding, regular periods  She had no inciting event.    Pain Inventory Average Pain 9 Pain Right Now 0 My pain is intermittent, sharp and stabbing  In the last 24 hours, has pain interfered with the following? General activity 0 Relation with others 0 Enjoyment of life 0 What TIME of day is your pain at its worst? varies Sleep (in general) Fair  Pain is worse with: unknown Pain improves with: nothing helps Relief from Meds: 0  ability to climb steps?  yes do you drive?  yes needs help with transfers Do you have any goals in this area?  yes  employed # of hrs/week [employer] 40 hours a week.   depression anxiety  Any changes since last visit?  no New Patient  Any changes since last visit?  no New Patient    Family History  Problem Relation Age of Onset  . Healthy Mother   . Healthy Father    Social History   Socioeconomic History  . Marital status: Single    Spouse name: Not on file  . Number of children: Not on file  . Years of education: Not on file  . Highest education level: Not on file  Occupational History  . Not on file  Tobacco Use  . Smoking status: Never Smoker  . Smokeless tobacco: Never Used  Vaping Use  . Vaping Use: Never  used  Substance and Sexual Activity  . Alcohol use: Yes    Comment: rarely   . Drug use: Never  . Sexual activity: Not on file  Other Topics Concern  . Not on file  Social History Narrative  . Not on file   Social Determinants of Health   Financial Resource Strain: Not on file  Food Insecurity: Not on file  Transportation Needs: Not on file  Physical Activity: Not on file  Stress: Not on file  Social Connections: Not on file   Past Surgical History:  Procedure Laterality Date  . WISDOM TOOTH EXTRACTION     early 5s   No past medical history on file. There were no vitals taken for this visit.  Opioid Risk Score:   Fall Risk Score:  `1  Depression screen PHQ 2/9  No flowsheet data found. Review of Systems  All other systems reviewed and are negative.      Objective:   Physical Exam  Gen: no distress, normal appearing HEENT: oral mucosa pink and moist, NCAT Cardio: Reg rate Chest: normal effort, normal rate of breathing Abd: soft, non-distended Ext: no edema Psych: pleasant, normal affect Skin: intact Neuro: Alert and oriented x3 Musculoskeletal: Protruding ribs bilateral 12th rib  anteriorly, TTP on left side      Assessment & Plan:  Mrs. Jungwirth is a 35 year old woman who presents to establish care for chest pains.   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 It is not extending for longer periods of time.  -Discussed current symptoms of pain and history of pain.  -Discussed benefits of exercise in reducing pain. -Denies night sweats.  -The pulmonologist told her it could be floating rib syndrome -XR abdomen and left ribs ordered -She used to take ibuprofen but stopped it because of her migraines.  -Discussed following foods that may reduce pain: 1) Ginger 2) Blueberries 3) Salmon 4) Pumpkin seeds 5) dark chocolate 6) turmeric 7) tart cherries 8) virgin olive oil 9) chilli  peppers 10) mint  Link to further information on diet for chronic pain: [email or web address]

## 2020-12-15 NOTE — Patient Instructions (Signed)
-  Discussed following foods that may reduce pain: 1) Ginger 2) Blueberries 3) Salmon 4) Pumpkin seeds 5) dark chocolate 6) turmeric 7) tart cherries 8) virgin olive oil 9) chilli peppers 10) mint  Link to further information on diet for chronic pain: [email or web address]  

## 2020-12-20 NOTE — Addendum Note (Signed)
Addended by: Horton Chin on: 12/20/2020 06:06 PM   Modules accepted: Orders

## 2020-12-29 ENCOUNTER — Other Ambulatory Visit: Payer: Self-pay | Admitting: Physical Medicine and Rehabilitation

## 2020-12-29 DIAGNOSIS — R1032 Left lower quadrant pain: Secondary | ICD-10-CM

## 2021-01-18 ENCOUNTER — Telehealth: Payer: Self-pay | Admitting: *Deleted

## 2021-01-18 ENCOUNTER — Other Ambulatory Visit: Payer: Self-pay | Admitting: Physical Medicine and Rehabilitation

## 2021-01-18 DIAGNOSIS — R0781 Pleurodynia: Secondary | ICD-10-CM

## 2021-01-18 DIAGNOSIS — R1032 Left lower quadrant pain: Secondary | ICD-10-CM

## 2021-01-18 NOTE — Telephone Encounter (Signed)
Paula Stokes called and says that there is something wrong with the way the ultrasound of her abd was ordered. I have left a message on her voicemail to callus back in am with more detail about what she was told so that I can alert Dr Carlis Abbott to make the changes.

## 2021-01-19 ENCOUNTER — Telehealth: Payer: Self-pay

## 2021-01-19 NOTE — Telephone Encounter (Signed)
Please call Margaretmary Lombard at [facility] phone number [phone] ext [phone]. Letitia Sabala order will requires some modifications.   Thank  You.

## 2021-01-22 ENCOUNTER — Encounter: Payer: 59 | Admitting: Physical Medicine and Rehabilitation

## 2021-01-23 NOTE — Telephone Encounter (Signed)
Calling now

## 2021-01-25 ENCOUNTER — Telehealth: Payer: Self-pay | Admitting: *Deleted

## 2021-01-25 NOTE — Telephone Encounter (Signed)
[name] called to report she still cannot get her ultrasound because the order is not correct.  I have called and spoke to [facility] and she says that looking at LLQ pain dx does not meet criteria to order 'abdominal ultrasound complete". They are not even able to visualize ribs so the rib pain dx does not fit either.  They need more information about what you are wanting to look at such as organs --they just need more information or maybe the test is not the correct test for what you are looking for.  Please give Paula Stokes a call back [phone].

## 2021-01-26 NOTE — Telephone Encounter (Signed)
Calling her now

## 2021-02-02 ENCOUNTER — Encounter: Payer: 59 | Admitting: Physical Medicine and Rehabilitation

## 2021-02-11 ENCOUNTER — Other Ambulatory Visit: Payer: Self-pay | Admitting: Physical Medicine and Rehabilitation

## 2021-02-11 DIAGNOSIS — R1032 Left lower quadrant pain: Secondary | ICD-10-CM

## 2021-02-12 NOTE — Telephone Encounter (Signed)
Please call April at [facility], ph [phone]. To discuss the recent ultrasound order. It needs to be re-ordered.

## 2021-02-16 ENCOUNTER — Ambulatory Visit
Admission: RE | Admit: 2021-02-16 | Discharge: 2021-02-16 | Disposition: A | Discharge status: Home / Self Care | Payer: 59 | Source: Ambulatory Visit | Attending: Physical Medicine and Rehabilitation | Admitting: Physical Medicine and Rehabilitation

## 2021-02-16 DIAGNOSIS — R1032 Left lower quadrant pain: Secondary | ICD-10-CM

## 2021-02-20 ENCOUNTER — Encounter: Payer: 59 | Attending: Physical Medicine and Rehabilitation | Admitting: Physical Medicine and Rehabilitation

## 2021-02-20 ENCOUNTER — Encounter: Payer: Self-pay | Admitting: Physical Medicine and Rehabilitation

## 2021-02-20 ENCOUNTER — Other Ambulatory Visit: Payer: Self-pay

## 2021-02-20 VITALS — Ht 66.0 in | Wt 115.0 lb

## 2021-02-20 DIAGNOSIS — R0781 Pleurodynia: Secondary | ICD-10-CM | POA: Insufficient documentation

## 2021-02-20 DIAGNOSIS — R1032 Left lower quadrant pain: Secondary | ICD-10-CM | POA: Insufficient documentation

## 2021-02-20 NOTE — Progress Notes (Signed)
Subjective:    Patient ID: Paula Stokes, female    DOB: [birth year], 35 y.o.   MRN: [id number]  HPI  Due to national recommendations of social distancing because of COVID 21, an audio/video tele-health visit is felt to be the most appropriate encounter for this patient at this time. See MyChart message from today for the patient's consent to a tele-health encounter with [facility]. This is a follow up tele-visit via MyChart Video. The patient is at home. MD is at office.   Paula Stokes is a 35 year old woman with bilateral mid-lower abdominal pain since she was 11.  1) Abdominal pain: -located bilaterally in lower quadrants below her ribs -worse on the right side.   -she has seen cardiology, pulmonology, ED, and has been told they are not sure what is wrong.  -she has never seen gastroenterology.  -she has been trying saffron capsules and pain has improved.   Average pain is 9/10. Pain right now is 0/10  The pain feels intermittent, sharp, and stabbing.   It is not associated with anxiety.   It frequently happens the week before her period, she feels because her body is under the most stress at that time.   When she lifts too much at her work. Or when she wears a tight bra, the pain feels worse.   She does not get constipated.   Normal bleeding, regular periods  She had no inciting event.    Pain Inventory Average Pain 9 Pain Right Now 0 My pain is intermittent, sharp and stabbing  In the last 24 hours, has pain interfered with the following? General activity 10 Relation with others 10 Enjoyment of life 10 What TIME of day is your pain at its worst? varies Sleep (in general) varies  Pain is worse with: unknown Pain improves with: nothing helps Relief from Meds: 0  ability to climb steps?  yes do you drive?  yes needs help with transfers Do you have any goals in this area?  yes  employed # of hrs/week [employer] 40 hours a week.   depression anxiety  Any changes since last visit?  no  Any changes since last visit?  no    Family History  Problem Relation Age of Onset  . Healthy Mother   . Healthy Father    Social History   Socioeconomic History  . Marital status: Single    Spouse name: Not on file  . Number of children: Not on file  . Years of education: Not on file  . Highest education level: Not on file  Occupational History  . Not on file  Tobacco Use  . Smoking status: Never Smoker  . Smokeless tobacco: Never Used  Vaping Use  . Vaping Use: Never used  Substance and Sexual Activity  . Alcohol use: Yes    Comment: rarely   . Drug use: Never  . Sexual activity: Not on file  Other Topics Concern  . Not on file  Social History Narrative  . Not on file   Social Determinants of Health   Financial Resource Strain: Not on file  Food Insecurity: Not on file  Transportation Needs: Not on file  Physical Activity: Not on file  Stress: Not on file  Social Connections: Not on file   Past Surgical History:  Procedure Laterality Date  . WISDOM TOOTH EXTRACTION     early 6s   No past medical history on file. Ht  5\' 6"  (1.676 m)   Wt 115 lb (52.2 kg)   BMI 18.56 kg/m   Opioid Risk Score:   Fall Risk Score:  `1  Depression screen PHQ 2/9  Depression screen PHQ 2/9 12/15/2020  Decreased Interest 1  Down, Depressed, Hopeless 1  PHQ - 2 Score 2  Altered sleeping 2  Tired, decreased energy 2  Change in appetite 0  Feeling bad or failure about yourself  1  Trouble concentrating 0  Moving slowly or fidgety/restless 0  Suicidal thoughts 0  PHQ-9 Score 7   Review of Systems  Constitutional: Negative.   HENT: Negative.   Eyes: Negative.   Respiratory: Positive for shortness of breath.   Gastrointestinal: Negative.   Endocrine: Negative.   Genitourinary: Negative.   Musculoskeletal:       Rib cage pain  Skin: Negative.   Allergic/Immunologic: Negative.    Neurological: Negative.   Hematological: Negative.   Psychiatric/Behavioral: Negative.   All other systems reviewed and are negative.      Objective:   Physical Exam Not performed as patient was seen via MyChart Video      Assessment & Plan:  Paula Stokes is a 35 year old woman who presents for follow-up of abdominal pain.   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 It is not extending for longer periods of time.  -Discussed current symptoms of pain and history of pain.  -Discussed benefits of exercise in reducing pain. -Denies night sweats.  -The pulmonologist told her it could be floating rib syndrome -Reviewed results of abdominal ultrasound with patient- shows likely hemangiomas, discussed that we can order liver MRI for f/u but she defers for now, pain is primarily on right side -pain has improved with saffron- I will get some for her at [employer] as she was having trouble finding it and is using capsules -has been unable to work due to pain, encouraged finding less demanding job -discussed naltrexone as an option for chronic pain if symptoms worsen.  -She used to take ibuprofen but stopped it because of her migraines.  -Discussed following foods that may reduce pain: 1) Ginger 2) Blueberries 3) Salmon 4) Pumpkin seeds 5) dark chocolate 6) turmeric 7) tart cherries 8) virgin olive oil 9) chilli peppers 10) mint  Link to further information on diet for chronic pain: [place]

## 2021-02-28 ENCOUNTER — Other Ambulatory Visit: Payer: 59

## 2021-03-14 ENCOUNTER — Encounter: Payer: 59 | Admitting: Physical Medicine and Rehabilitation

## 2021-10-23 IMAGING — DX DG ABDOMEN 2V
2 series · 2 of 2 positions shown · non-contrast
Comparison: None.

CLINICAL DATA: Abdominal pain. Occasional left side the transverse
to the right. Looking for floating rib syndrome?

EXAM:
ABDOMEN - 2 VIEW

[abdomen erect]
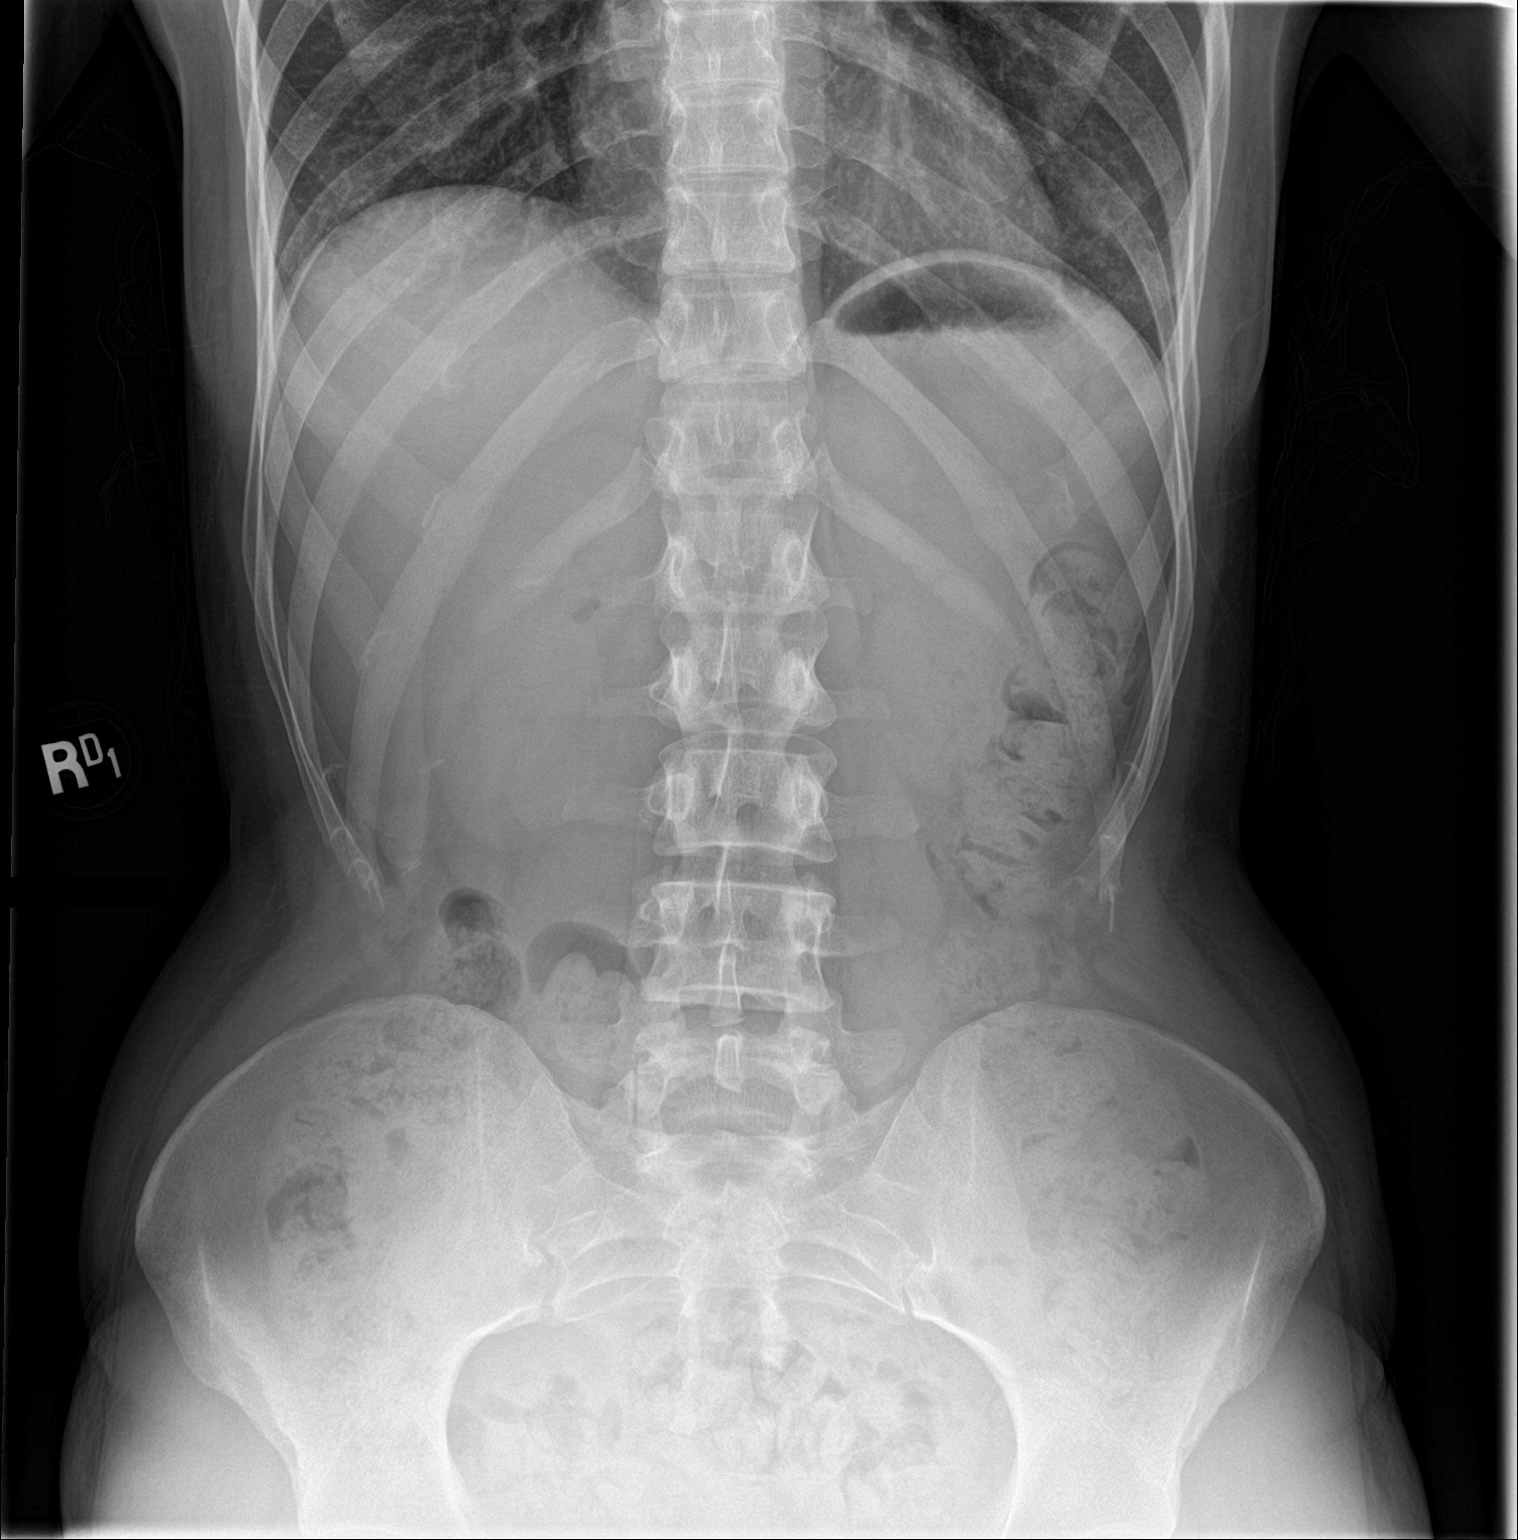

[abdomen supine]
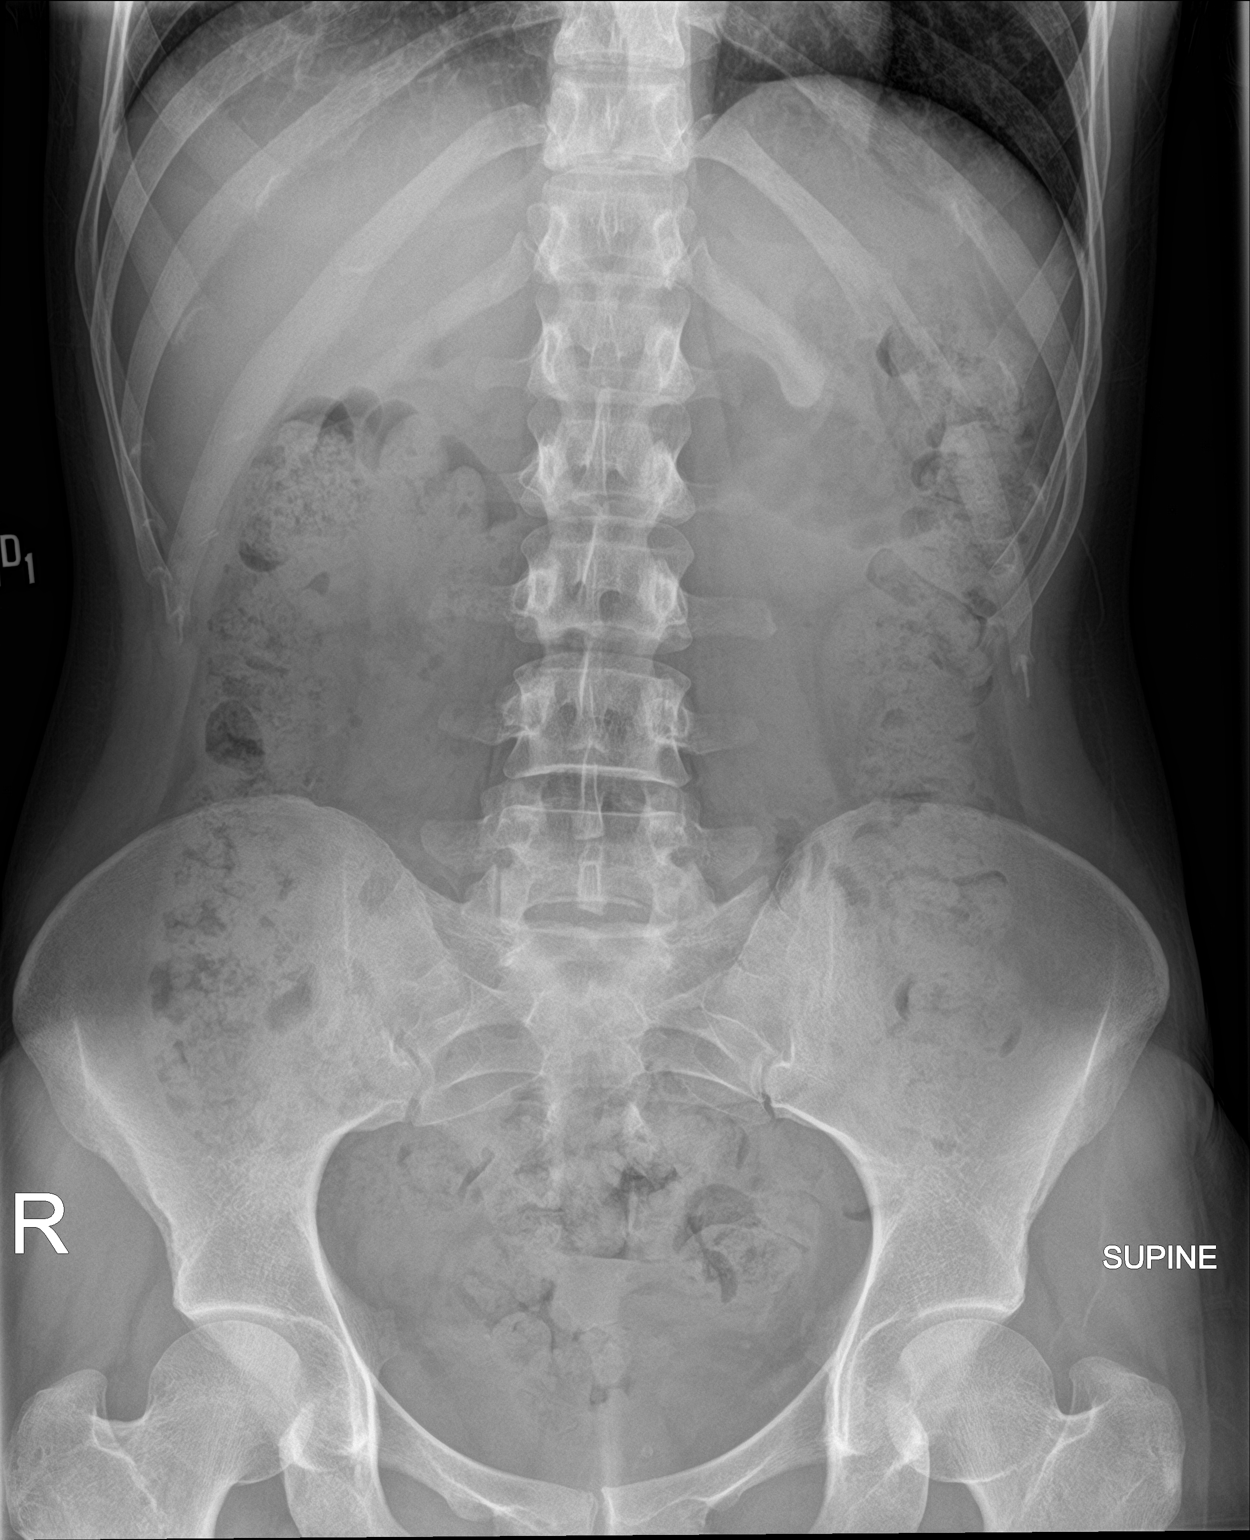

[2 of 2 positions shown; findings below may reference images not displayed]

FINDINGS: The bowel gas pattern is normal. Stool throughout the colon. There
is no evidence of free air. No radio-opaque calculi or other
significant radiographic abnormality is seen.

Five non-rib-bearing lumbar vertebral bodies are noted.
IMPRESSION: Negative.

## 2021-12-25 IMAGING — US US ABDOMEN COMPLETE
1 series · 13 of 25 positions shown · non-contrast
Comparison: None.

CLINICAL DATA: Upper abdominal pain

EXAM:
ABDOMEN ULTRASOUND COMPLETE

[Series 1: us abdomen complete · 0.23mm/px · 13 of 101 slices shown]
[im 1/101]
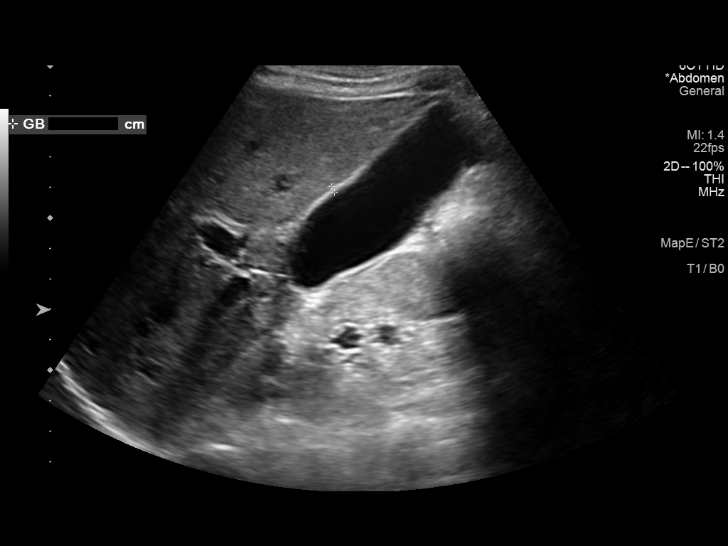
[im 9/101]
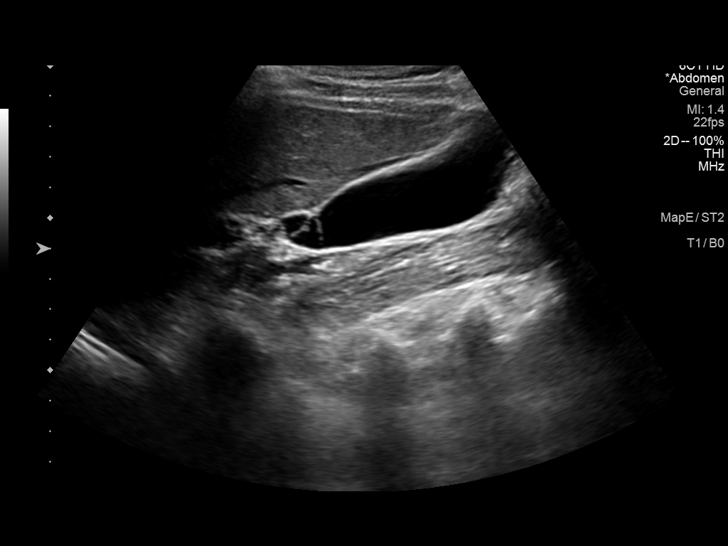
[im 17/101]
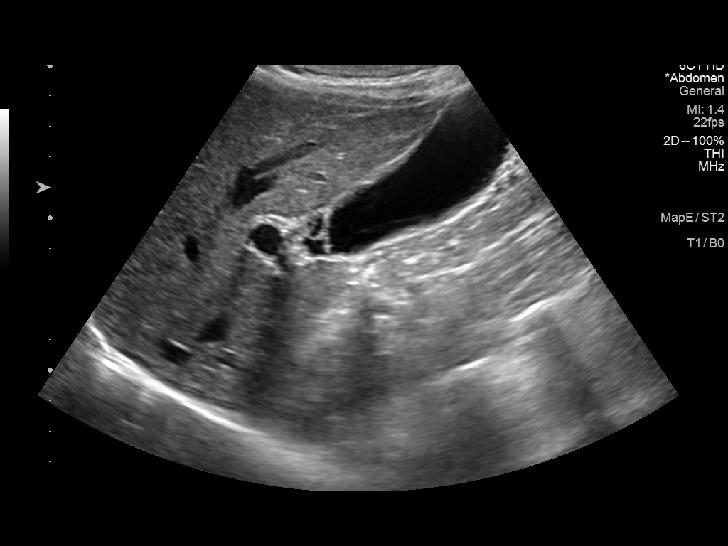
[im 26/101]
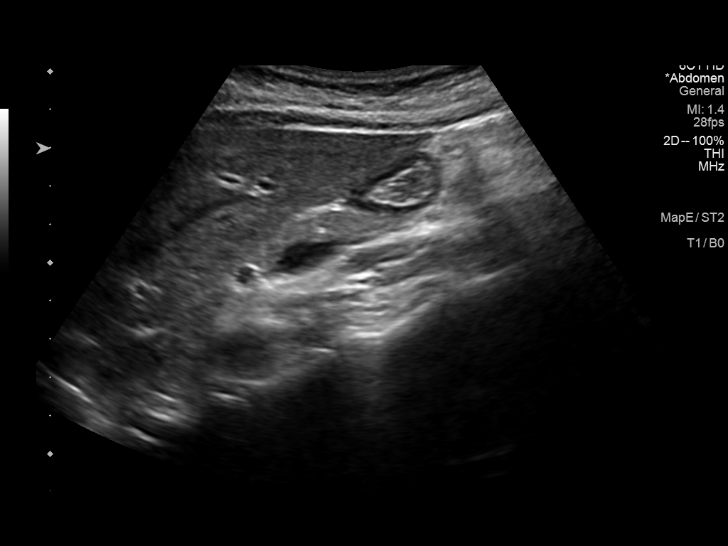
[im 34/101]
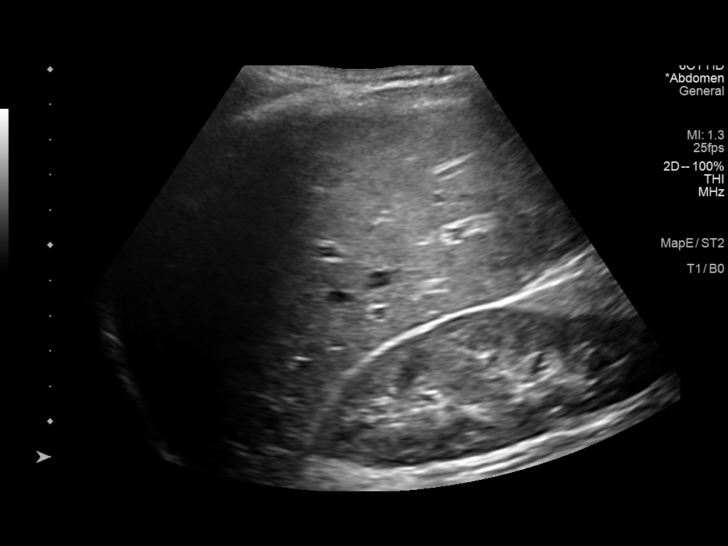
[im 42/101]
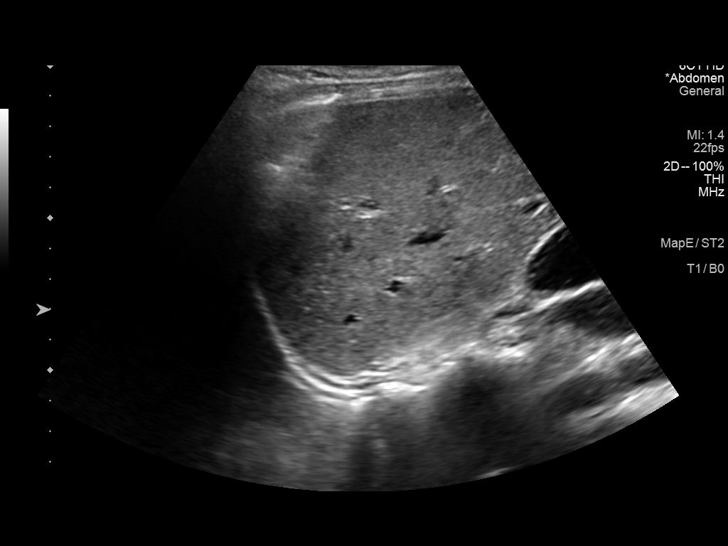
[im 51/101]
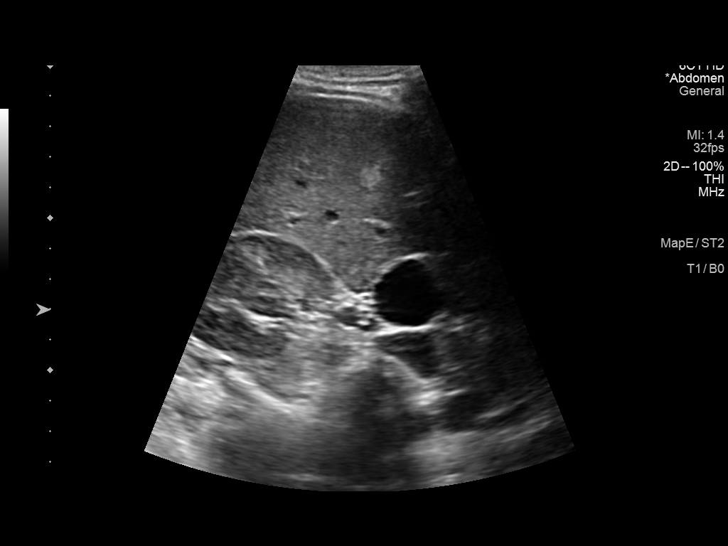
[im 59/101]
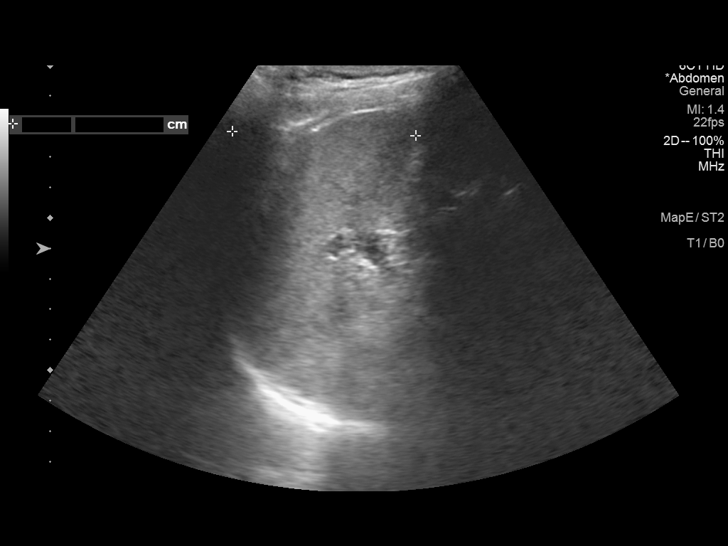
[im 67/101]
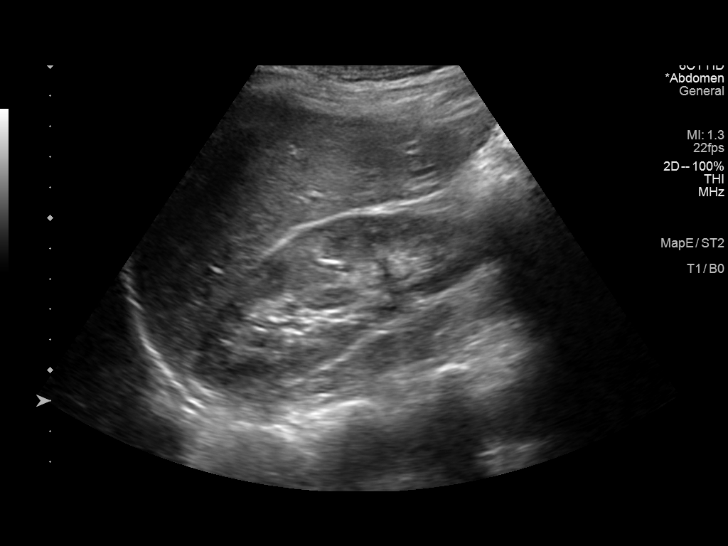
[im 76/101]
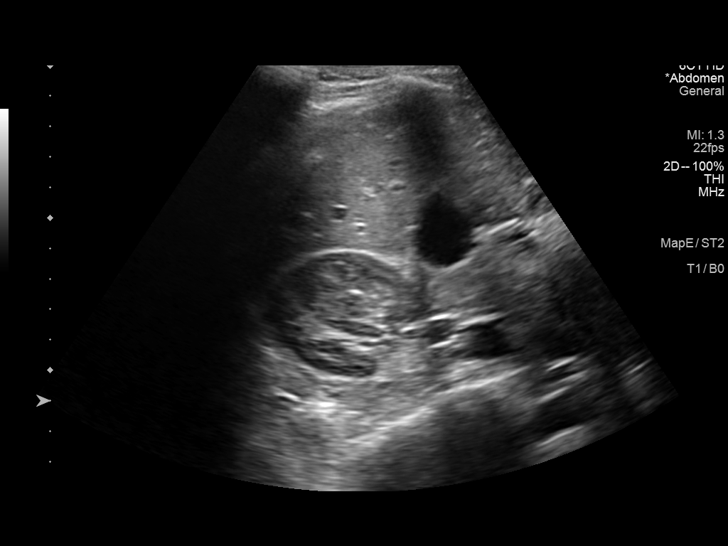
[im 84/101]
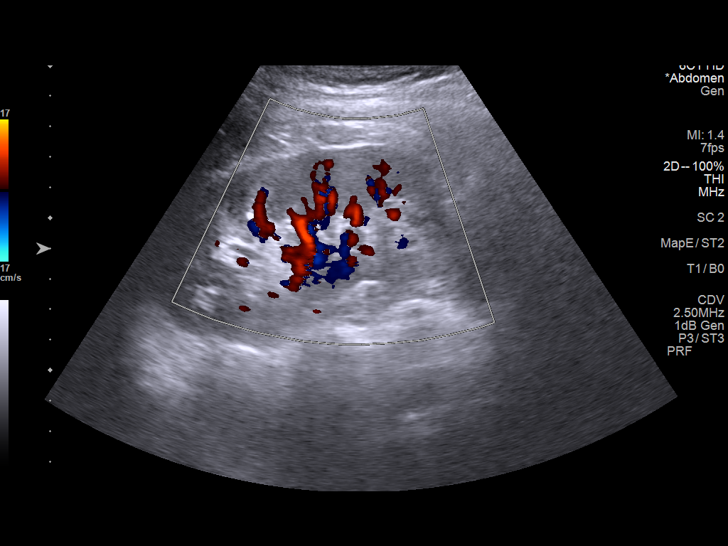
[im 92/101]
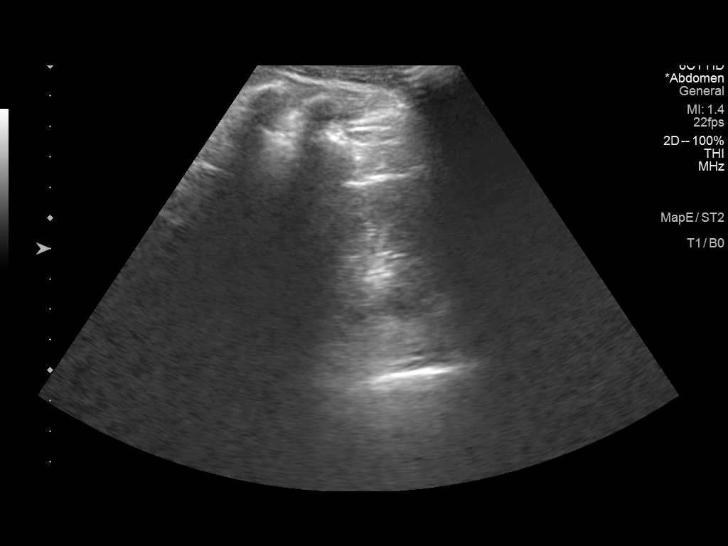
[im 101/101]
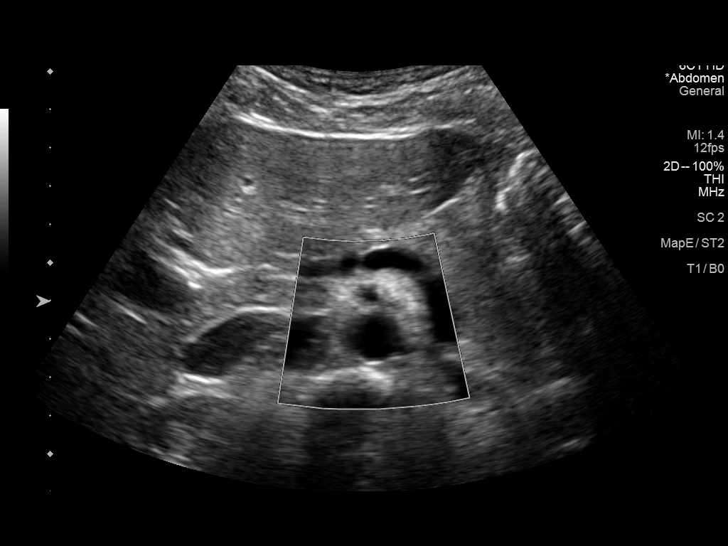

[13 of 25 positions shown; findings below may reference images not displayed]

FINDINGS: Gallbladder: 9 mm echogenic focus within the gallbladder neck could
reflect a polyp or tumefactive sludge. No evidence of gallbladder
wall thickening or pericholecystic fluid. Negative sonographic
Murphy sign.

Common bile duct: Diameter: 2 mm

Liver: Posterior aspect right lobe liver there is a 1.1 x 0.8 x
cm hyperechoic focus. More anteriorly within the inferior aspect
right lobe there is a 1.0 x 0.9 x 0.8 cm similar hyperechoic focus.
Statistically, these likely reflect hemangiomas. Dedicated liver MRI
or CT may be useful for further evaluation. The remainder of the
liver parenchyma is unremarkable. Portal vein is patent on color
Doppler imaging with normal direction of blood flow towards the
liver.

IVC: No abnormality visualized.

Pancreas: Visualized portion unremarkable.

Spleen: Size and appearance within normal limits.

Right Kidney: Length: 11.2 cm. Echogenicity within normal limits. No
mass or hydronephrosis visualized.

Left Kidney: Length: 10.6 cm. Echogenicity within normal limits. No
mass or hydronephrosis visualized.

Abdominal aorta: No aneurysm visualized.

Other findings: None.
IMPRESSION: 1. Tumefactive sludge versus polyp within the gallbladder neck. No
evidence of acute cholecystitis.
2. 2 separate approximately 1 cm hyperechoic foci within the right
lobe liver, statistically likely hemangiomas. Dedicated liver MRI
could be performed for complete characterization.
3. Otherwise unremarkable exam.
